# Patient Record
Sex: Female | Born: 1965 | Race: White | Hispanic: No | Marital: Married | Smoking: Never smoker
Health system: Southern US, Community
[De-identification: ages and names within clinical notes are randomized; demographics above are authoritative.]

## PROBLEM LIST (undated history)

## (undated) DIAGNOSIS — N200 Calculus of kidney: Secondary | ICD-10-CM

## (undated) DIAGNOSIS — I493 Ventricular premature depolarization: Secondary | ICD-10-CM

## (undated) DIAGNOSIS — E079 Disorder of thyroid, unspecified: Secondary | ICD-10-CM

## (undated) HISTORY — PX: CHOLECYSTECTOMY: SHX55

## (undated) HISTORY — DX: Ventricular premature depolarization: I49.3

## (undated) HISTORY — PX: OTHER SURGICAL HISTORY: SHX169

## (undated) HISTORY — PX: KNEE SURGERY: SHX244

---

## 1998-07-23 ENCOUNTER — Other Ambulatory Visit: Admission: RE | Admit: 1998-07-23 | Discharge: 1998-07-23 | Payer: Self-pay | Admitting: *Deleted

## 1999-06-11 ENCOUNTER — Other Ambulatory Visit: Admission: RE | Admit: 1999-06-11 | Discharge: 1999-06-11 | Payer: Self-pay | Admitting: *Deleted

## 2000-05-31 ENCOUNTER — Emergency Department (HOSPITAL_COMMUNITY): Admission: EM | Admit: 2000-05-31 | Discharge: 2000-05-31 | Payer: Self-pay | Admitting: Emergency Medicine

## 2000-05-31 ENCOUNTER — Encounter: Payer: Self-pay | Admitting: Emergency Medicine

## 2000-06-02 ENCOUNTER — Encounter: Payer: Self-pay | Admitting: Emergency Medicine

## 2000-06-02 ENCOUNTER — Ambulatory Visit (HOSPITAL_COMMUNITY): Admission: RE | Admit: 2000-06-02 | Discharge: 2000-06-02 | Payer: Self-pay | Admitting: Emergency Medicine

## 2002-11-01 ENCOUNTER — Inpatient Hospital Stay (HOSPITAL_COMMUNITY): Admission: EM | Admit: 2002-11-01 | Discharge: 2002-11-04 | Payer: Self-pay | Admitting: Hematology & Oncology

## 2002-11-01 ENCOUNTER — Encounter: Payer: Self-pay | Admitting: Hematology & Oncology

## 2002-11-02 ENCOUNTER — Encounter: Payer: Self-pay | Admitting: Oncology

## 2002-11-04 ENCOUNTER — Encounter: Payer: Self-pay | Admitting: Hematology & Oncology

## 2007-03-16 ENCOUNTER — Ambulatory Visit: Payer: Self-pay | Admitting: Family Medicine

## 2007-03-16 ENCOUNTER — Observation Stay (HOSPITAL_COMMUNITY): Admission: AD | Admit: 2007-03-16 | Discharge: 2007-03-18 | Payer: Self-pay | Admitting: Family Medicine

## 2007-07-04 ENCOUNTER — Encounter: Payer: Self-pay | Admitting: Internal Medicine

## 2007-07-04 ENCOUNTER — Ambulatory Visit (HOSPITAL_COMMUNITY): Admission: RE | Admit: 2007-07-04 | Discharge: 2007-07-04 | Payer: Self-pay | Admitting: Internal Medicine

## 2007-07-06 ENCOUNTER — Encounter: Payer: Self-pay | Admitting: Internal Medicine

## 2008-01-04 ENCOUNTER — Ambulatory Visit: Payer: Self-pay | Admitting: Internal Medicine

## 2008-01-04 DIAGNOSIS — R079 Chest pain, unspecified: Secondary | ICD-10-CM | POA: Insufficient documentation

## 2008-01-04 DIAGNOSIS — R059 Cough, unspecified: Secondary | ICD-10-CM | POA: Insufficient documentation

## 2008-01-04 DIAGNOSIS — R05 Cough: Secondary | ICD-10-CM

## 2008-01-04 DIAGNOSIS — J45909 Unspecified asthma, uncomplicated: Secondary | ICD-10-CM | POA: Insufficient documentation

## 2008-01-04 DIAGNOSIS — R0602 Shortness of breath: Secondary | ICD-10-CM | POA: Insufficient documentation

## 2008-01-04 DIAGNOSIS — R062 Wheezing: Secondary | ICD-10-CM | POA: Insufficient documentation

## 2008-01-04 LAB — CONVERTED CEMR LAB: IgE (Immunoglobulin E), Serum: 9.7 intl units/mL (ref 0.0–180.0)

## 2008-01-07 ENCOUNTER — Telehealth: Payer: Self-pay | Admitting: Internal Medicine
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
## 2010-10-08 NOTE — Discharge Summary (Signed)
[name], [name]                       ACCOUNT NO.:  [id number]   MEDICAL RECORD NO.:  [id number]                   PATIENT TYPE:  INP   LOCATION:  0260                                 FACILITY:  [facility]   PHYSICIAN:  Rose Phi. Myna Hidalgo, M.D.              DATE OF BIRTH:  30-May-1965   DATE OF ADMISSION:  11/01/2002  DATE OF DISCHARGE:  11/04/2002                                 DISCHARGE SUMMARY   DISCHARGE DIAGNOSES:  1. Transient pancytopenia secondary to viral versus drug effect.  2. Elevated liver function tests.  3. Bronchitis.  4. Depression.   CONDITION ON DISCHARGE:  Stable.   ACTIVITY:  As tolerated.   DIET:  No fried or greasy food for four days.   FOLLOW UP:  The patient will come back to the office in one week for blood  work.   MEDICATIONS:  1. Wellbutrin XL 150 mg p.o. daily.  2. Tequin 400 mg p.o. daily x4 days.  3. Tussionex elixir 5 mL p.o. q.12h. p.r.n.  4. Phenergan 25 mg p.o. q.6h. p.r.n.   HOSPITAL COURSE:  The patient was admitted after coming to the office on  Friday.  She presented with pancytopenia.  She had a fever.  Her blood smear  in the office was pretty much unremarkable.   We admitted her.  Blood cultures were taken.  These were all negative.  She  had an IV started.  She was on IV fluids.  She was found to have some  elevated LFTs.  She does have history of cholecystectomy eight years ago.   She did have a chest x-ray which showed some bronchitic changes.  No lung  infiltrates were noted.  No adenopathy was appreciated in the mediastinum.   She was started on IV antibiotics with IV Tequin.  She was afebrile during  her hospital course.  She had a little bit of nausea, but no vomiting.  She  was eating well.  Her cough was much improved.   On Monday the 14th I had planned to do a bone marrow test on her.  However,  I felt that her blood work started to improve.  Her platelet count had gone  to 121,000.  White cell count had gone to  2600.   All of her laboratory work was unremarkable for anemia.  Her haptoglobin was  normal.  Reticulocyte count was negative.  Direct Coombs' was negative.   We did schedule her for an ultrasound of her liver prior to discharge.  These results are pending.   She felt well enough that she wished to go home.   Upon discharge her LFTs were still elevated.  Alkaline phosphatase was 171,  SGPT 350, SGOT 267.  Albumin was 2.8, protein 5.6, sodium 144, potassium  3.7.  LDH was 350.   She had no hepatomegaly on examination.  Her liver was nontender to  palpation.  She had no  splenomegaly.                                               Rose Phi. Myna Hidalgo, M.D.    PRE/MEDQ  D:  11/04/2002  T:  11/04/2002  Job:  [id number]   cc:   Tracey Harries, M.D.  [address]  [place]  [place] [address]  Fax: [phone]
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
## 2010-10-08 NOTE — H&P (Signed)
[name], [name]                       ACCOUNT NO.:  [id number]   MEDICAL RECORD NO.:  [id number]                   PATIENT TYPE:  INP   LOCATION:  0260                                 FACILITY:  [facility]   PHYSICIAN:  Rose Phi. Myna Hidalgo, M.D.              DATE OF BIRTH:  1965-07-24   DATE OF ADMISSION:  11/01/2002  DATE OF DISCHARGE:                                HISTORY & PHYSICAL   REASON FOR ADMISSION:  1. Pancytopenia.  2. Fever.   HISTORY OF PRESENT ILLNESS:  The patient is a very nice 45 year old white  female with a past history significant for situational depression.  She has  been on Wellbutrin for quite a while.  She has been started on Depakote  about four months ago.   She had been feeling well until recently.  She then developed fevers and  weakness.  She had a dry cough.  She had a little bit of a headache.   She was seen at, I think, at Urgent Care.  She had a CBC done a the Urgent  Care.  This showed a white cell count of 1300, hemoglobin 12, hematocrit 35,  and platelet count 71,000.  Her ANC was 800.   Dr. Everlene Other called the office.  It was felt that she needed to be seen the  next day in the office.   She came into the office.  It was obvious that she was not feeling well.  She had a dry cough.  She had a temperature of 100.3.  Subsequently, she is  being admitted for evaluation of her pancytopenia and IV antibiotics.   PAST MEDICAL HISTORY:  Remarkable for the depression.   ALLERGIES:  PENICILLIN.   MEDICATIONS:  1. Wellbutrin XL 150 mg p.o. daily.  2. Depakote 250 mg p.o. q.h.s.  3. Tequin 400 mg p.o. daily.   SOCIAL HISTORY:  Negative to tobacco or alcohol use.  There is no  occupational exposures.   FAMILY HISTORY:  Noncontributory.   REVIEW OF SYSTEMS:  As in the History of Present Illness.   PHYSICAL EXAMINATION:  GENERAL:  This is a well-developed, well-nourished  white female in no obvious distress.  VITAL SIGNS:  Temperature 100.3,  pulse 103, respiratory rate 20, blood  pressure 119/81, weight 143.  HEAD AND NECK:  Shows normocephalic, atraumatic skull.  She has no  mucositis.  There is no scleral icterus.  Pupils react appropriately.  She  had no adenopathy in the neck.  Her neck is supple with good range of  motion.  Thyroid is nonpalpable.  LUNGS:  With clear breath sounds bilaterally.  No rales, wheezes, or rhonchi  are noted.  CARDIAC:  Tachycardia but regular.  No murmurs, rubs, or bruits.  ABDOMEN:  Soft with good bowel sounds.  There was no fluid wave.  There is  no palpable hepatosplenomegaly.  BACK:  No tenderness of the spine, ribs or  hips.  EXTREMITIES:  Shows no clubbing, cyanosis, or edema.  SKIN:  No rashes, ecchymoses, or petechia.  NEUROLOGICAL:  Shows no focal neurological deficits.   LABORATORY STUDIES:  White cell count 1400, ANC 500, hemoglobin 11,  hematocrit 33, platelet count 79,000.   IMPRESSION:  Ms. Broden is a 45 year old female with pancytopenia.  The  etiology is unclear.  A blood smear certainly did not show any obvious  leukemia.  She certainly could have an underlying leukemia particularly  acute promyelocytic leukemia.  However, she has not had any problems with  bleeding or bruising.   This could be a viral syndrome.  It could be possible that the Depakote is a  culprit although this would be very rare.   She needs to have a bone marrow done.  She needs IV antibiotics.  We need to  check cultures on her.  We need to do a chest x-ray.                                               Rose Phi. Myna Hidalgo, M.D.    PRE/MEDQ  D:  11/01/2002  T:  11/01/2002  Job:  [id number]

## 2011-02-06 ENCOUNTER — Encounter: Payer: Self-pay | Admitting: *Deleted

## 2011-02-06 ENCOUNTER — Emergency Department (HOSPITAL_BASED_OUTPATIENT_CLINIC_OR_DEPARTMENT_OTHER)
Admission: EM | Admit: 2011-02-06 | Discharge: 2011-02-06 | Disposition: A | Discharge status: Home / Self Care | Payer: BC Managed Care – PPO | Attending: Emergency Medicine | Admitting: Emergency Medicine

## 2011-02-06 ENCOUNTER — Emergency Department (INDEPENDENT_AMBULATORY_CARE_PROVIDER_SITE_OTHER): Payer: BC Managed Care – PPO

## 2011-02-06 DIAGNOSIS — N269 Renal sclerosis, unspecified: Secondary | ICD-10-CM

## 2011-02-06 DIAGNOSIS — R109 Unspecified abdominal pain: Secondary | ICD-10-CM | POA: Insufficient documentation

## 2011-02-06 DIAGNOSIS — D1809 Hemangioma of other sites: Secondary | ICD-10-CM

## 2011-02-06 DIAGNOSIS — N12 Tubulo-interstitial nephritis, not specified as acute or chronic: Secondary | ICD-10-CM | POA: Insufficient documentation

## 2011-02-06 DIAGNOSIS — M8569 Other cyst of bone, multiple sites: Secondary | ICD-10-CM

## 2011-02-06 HISTORY — DX: Calculus of kidney: N20.0

## 2011-02-06 HISTORY — DX: Disorder of thyroid, unspecified: E07.9

## 2011-02-06 LAB — URINALYSIS, ROUTINE W REFLEX MICROSCOPIC
Protein, ur: NEGATIVE mg/dL
Specific Gravity, Urine: 1.016 (ref 1.005–1.030)
Urobilinogen, UA: 0.2 mg/dL (ref 0.0–1.0)

## 2011-02-06 LAB — URINE MICROSCOPIC-ADD ON

## 2011-02-06 LAB — PREGNANCY, URINE: Preg Test, Ur: NEGATIVE

## 2011-02-06 MED ORDER — OXYCODONE-ACETAMINOPHEN 5-325 MG PO TABS
2.0000 | ORAL_TABLET | Freq: Once | ORAL | Status: AC
  Administered 2011-02-06: 2 via ORAL
  Filled 2011-02-06 (×2): qty 1

## 2011-02-06 MED ORDER — ONDANSETRON HCL 4 MG PO TABS: ORAL_TABLET | ORAL | Status: DC

## 2011-02-06 MED ORDER — ONDANSETRON 8 MG PO TBDP
8.0000 mg | ORAL_TABLET | Freq: Once | ORAL | Status: AC
  Administered 2011-02-06: 8 mg via ORAL
  Filled 2011-02-06: qty 1

## 2011-02-06 MED ORDER — NAPROXEN 500 MG PO TABS: 500.0000 mg | ORAL_TABLET | Freq: Two times a day (BID) | ORAL | Status: DC

## 2011-02-06 MED ORDER — OXYCODONE-ACETAMINOPHEN 5-325 MG PO TABS: 2.0000 | ORAL_TABLET | ORAL | Status: AC | PRN

## 2011-02-06 MED ORDER — PHENAZOPYRIDINE HCL 200 MG PO TABS: 200.0000 mg | ORAL_TABLET | Freq: Three times a day (TID) | ORAL | Status: AC

## 2011-02-06 MED ORDER — CIPROFLOXACIN HCL 500 MG PO TABS
500.0000 mg | ORAL_TABLET | Freq: Once | ORAL | Status: AC
  Administered 2011-02-06: 500 mg via ORAL
  Filled 2011-02-06: qty 1

## 2011-02-06 MED ORDER — CIPROFLOXACIN HCL 500 MG PO TABS: 500.0000 mg | ORAL_TABLET | Freq: Two times a day (BID) | ORAL | Status: AC

## 2011-02-06 NOTE — ED Notes (Signed)
Rx x 5 given at d/c- pt d/c home with ride

## 2011-02-06 NOTE — ED Provider Notes (Signed)
History     CSN: [id number] Arrival date & time: 02/06/2011  6:28 PM Scribed for Brandy Mackie, MD, the patient was seen in room MH05/MH05. This chart was scribed by Katha Cabal. This patient's care was started at 7:50PM.    Chief Complaint  Patient presents with  . Flank Pain     HPI  [name] is a 45 y.o. female who presents to the Emergency Department complaining of gradual worsening of sharp left flank pain with associated urinary frequency, chills, and nausea. Pt noticed initial dull pain on Friday, 3 days ago.  Pain has been persistent since last night.   Pt has had urinary frequency but no dysuria.  Pain somewhat similar to last kidney stone.  No fevers, no vomiting, but has been nauseated. Eating and drinking well.   Denies vaginal discharge, urinary and bowel incontinence.  Patient notes that she took her last Zithromax pill today that was prescribed for cold.  Patient adds that she has a history of kidney stones and was able to pass a small stone during her last episode 10-12 years ago.   PAST MEDICAL HISTORY:  Past Medical History  Diagnosis Date  . Kidney stone   . Endometriosis   . Asthma   . Thyroid disease     PAST SURGICAL HISTORY:  Past Surgical History  Procedure Date  . Cholecystectomy   . Knee surgery   . Laproscopy     FAMILY HISTORY:  History reviewed. No pertinent family history.   SOCIAL HISTORY: History   Social History  . Marital Status: Married    Spouse Name: N/A    Number of Children: N/A  . Years of Education: N/A   Social History Main Topics  . Smoking status: Current Everyday Smoker  . Smokeless tobacco: None  . Alcohol Use: No  . Drug Use: No  . Sexually Active:    Other Topics Concern  . None   Social History Narrative  . None     Review of Systems 10 Systems reviewed and are negative for acute change except as noted in the HPI.  Allergies  Penicillins  Home Medications   Current Outpatient Rx  Name  Route Sig Dispense Refill  . AZITHROMYCIN 250 MG PO TABS Oral Take 250 mg by mouth daily.     . BUDESONIDE-FORMOTEROL FUMARATE 80-4.5 MCG/ACT IN AERO Inhalation Inhale 2 puffs into the lungs 2 (two) times daily.      Marland Kitchen CAPSAICIN 0.025 % EX PADS Apply externally Apply 1 patch topically every 8 (eight) hours as needed. pain     . LORATADINE 10 MG PO TABS Oral Take 10 mg by mouth daily.      . OLOPATADINE HCL 0.6 % NA SOLN Nasal Place 1 spray into the nose daily.      . TRIAMCINOLONE ACETONIDE 55 MCG/ACT NA INHA Nasal Place 2 sprays into the nose daily.        Physical Exam    BP 156/108  Pulse 86  Temp(Src) 98.1 F (36.7 C) (Oral)  Resp 20  Ht 5\' 4"  (1.626 m)  Wt 145 lb (65.772 kg)  BMI 24.89 kg/m2  SpO2 100%  LMP 01/30/2011  Physical Exam  Nursing note and vitals reviewed. Constitutional: She is oriented to person, place, and time. She appears well-developed and well-nourished.  HENT:  Head: Normocephalic and atraumatic.  Eyes: EOM are normal. Pupils are equal, round, and reactive to light.  Neck: Normal range of motion. Neck supple.  Cardiovascular: Normal rate and regular rhythm.   Pulmonary/Chest: Effort normal and breath sounds normal. She has no wheezes. She has no rales.  Abdominal: Soft. There is CVA tenderness (left ).       LLQ tenderness  Musculoskeletal: Normal range of motion. She exhibits no edema.  Neurological: She is alert and oriented to person, place, and time.  Skin: Skin is warm and dry.  Psychiatric: She has a normal mood and affect. Her behavior is normal.    ED Course  Procedures OTHER DATA REVIEWED: Nursing notes, vital signs, and past medical records reviewed.   DIAGNOSTIC STUDIES: Oxygen Saturation is 100% on room air, normal by my interpretation.     LABS / RADIOLOGY:  Results for orders placed during the hospital encounter of 02/06/11  URINALYSIS, ROUTINE W REFLEX MICROSCOPIC      Component Value Range   Color, Urine YELLOW  YELLOW     Appearance CLOUDY (*) CLEAR    Specific Gravity, Urine 1.016  1.005 - 1.030    pH 7.0  5.0 - 8.0    Glucose, UA NEGATIVE  NEGATIVE (mg/dL)   Hgb urine dipstick MODERATE (*) NEGATIVE    Bilirubin Urine NEGATIVE  NEGATIVE    Ketones, ur NEGATIVE  NEGATIVE (mg/dL)   Protein, ur NEGATIVE  NEGATIVE (mg/dL)   Urobilinogen, UA 0.2  0.0 - 1.0 (mg/dL)   Nitrite NEGATIVE  NEGATIVE    Leukocytes, UA LARGE (*) NEGATIVE   PREGNANCY, URINE      Component Value Range   Preg Test, Ur NEGATIVE    URINE MICROSCOPIC-ADD ON      Component Value Range   Squamous Epithelial / LPF MANY (*) RARE    WBC, UA TOO NUMEROUS TO COUNT  <3 (WBC/hpf)   RBC / HPF 7-10  <3 (RBC/hpf)   Bacteria, UA MANY (*) RARE      Ct Abdomen Pelvis Wo Contrast  02/06/2011  *RADIOLOGY REPORT*  Clinical Data: Left flank pain.  CT ABDOMEN AND PELVIS WITHOUT CONTRAST  Technique:  Multidetector CT imaging of the abdomen and pelvis was performed following the standard protocol without intravenous contrast.  Comparison: Report CT abdomen 11/02/2002.  Findings: Lung bases are clear.  22 mm x 21 mm low density lesion is present in the right hepatic lobe.  This was previously described as 2 cm and consistent with hemangioma.  By report, no interval change.  Cholecystectomy.  Right upper pole renal cyst is present measuring 35 mm.  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  No ureteral calculi are present.  The left kidney appears within normal limits.  The spleen shows old granulomatous disease.  Normal adrenal glands bilaterally.  Pancreas is grossly unremarkable. Unenhanced CT was performed per clinician order.  Lack of IV contrast limits sensitivity and specificity, especially for evaluation of abdominal/pelvic solid viscera.  Large stool burden is present in the right colon.  No right lower quadrant inflammatory changes.  Normal  appendix.  Uterus and adnexa appear normal.  Distal colon appears normal.  No free fluid.  No free air. Urinary bladder normal.  1 cm peripherally sclerotic cyst is present in the left femoral head.  Benign bone island is present in the right femoral head.  No aggressive osseous lesions are identified.  Left SI joint degenerative disease and vacuum joint.  IMPRESSION: 1.  No acute abnormality. 2.  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 3.  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 4.  Cholecystectomy. 5.  Old granulomatous disease of the spleen.  Original Report Authenticated By: Andreas Newport, M.D.     ED COURSE / COORDINATION OF CARE:  Orders Placed This Encounter  Procedures  . Urine culture  . CT Abdomen Pelvis Wo Contrast  . Urinalysis with microscopic  . Pregnancy, urine  . Urine microscopic-add on    MDM: Pt with worsening left flank pain x 3 days, urine with many epis but also with tntc wbc.  No stone seen on ct scan.  Will treat for pyelonephritis.  No fevers, no vomiting, feel pt is good candidate for outpatient management.  Discussed with pt and husband reasons for return and possible admission if she is failing outpatient management.   IMPRESSION: Pyelonephritis   MEDICATIONS GIVEN IN THE E.D. Scheduled Meds:    . ciprofloxacin  500 mg Oral Once  . ondansetron  8 mg Oral Once  . oxyCODONE-acetaminophen  2 tablet Oral Once        I personally performed the services described in this documentation, which was scribed in my presence. The recorded information has been reviewed and considered. Brandy Mackie, MD       Brandy Mackie, MD 02/06/11 [phone]

## 2011-02-06 NOTE — ED Notes (Signed)
Pt has hx of kidney stones. Left flank pain and nausea since last p.m.

## 2011-02-07 LAB — URINE CULTURE

## 2011-03-02 LAB — BASIC METABOLIC PANEL
Calcium: 9.4
GFR calc Af Amer: >60
GFR calc non Af Amer: >60
Potassium: 4.3
Sodium: 135

## 2011-03-02 LAB — CBC
HCT: 37.1
Hemoglobin: 12.5
WBC: 9.4

## 2011-12-05 ENCOUNTER — Encounter: Payer: Self-pay | Admitting: Cardiology

## 2011-12-06 ENCOUNTER — Ambulatory Visit (INDEPENDENT_AMBULATORY_CARE_PROVIDER_SITE_OTHER): Payer: BC Managed Care – PPO | Admitting: Cardiology

## 2011-12-06 ENCOUNTER — Encounter: Payer: Self-pay | Admitting: Cardiology

## 2011-12-06 VITALS — BP 134/92 | HR 86 | Ht 64.0 in | Wt 146.1 lb

## 2011-12-06 DIAGNOSIS — I10 Essential (primary) hypertension: Secondary | ICD-10-CM

## 2011-12-06 DIAGNOSIS — I493 Ventricular premature depolarization: Secondary | ICD-10-CM

## 2011-12-06 DIAGNOSIS — R0602 Shortness of breath: Secondary | ICD-10-CM

## 2011-12-06 DIAGNOSIS — R9431 Abnormal electrocardiogram [ECG] [EKG]: Secondary | ICD-10-CM

## 2011-12-06 DIAGNOSIS — R079 Chest pain, unspecified: Secondary | ICD-10-CM

## 2011-12-06 DIAGNOSIS — R002 Palpitations: Secondary | ICD-10-CM

## 2011-12-06 DIAGNOSIS — I4949 Other premature depolarization: Secondary | ICD-10-CM

## 2011-12-06 MED ORDER — BISOPROLOL FUMARATE 5 MG PO TABS: 2.5000 mg | ORAL_TABLET | Freq: Every day | ORAL | Status: DC

## 2011-12-06 NOTE — Patient Instructions (Addendum)
Continue current medications as listed.  Your physician has requested that you have an exercise tolerance test. For further information please visit [email or web address] Please also follow instruction sheet, as given.  Your physician has recommended that you wear a holter monitor. Holter monitors are medical devices that record the heart's electrical activity. Doctors most often use these monitors to diagnose arrhythmias. Arrhythmias are problems with the speed or rhythm of the heartbeat. The monitor is a small, portable device. You can wear one while you do your normal daily activities. This is usually used to diagnose what is causing palpitations/syncope (passing out).  Your physician has requested that you have an echocardiogram. Echocardiography is a painless test that uses sound waves to create images of your heart. It provides your doctor with information about the size and shape of your heart and how well your heart's chambers and valves are working. This procedure takes approximately one hour. There are no restrictions for this procedure.  Follow up will be placed on the results of testing.

## 2011-12-06 NOTE — Progress Notes (Signed)
HPI The patient presents for evaluation of the jaw arm discomfort. This has been going on for a couple of months. She describes discomfort that starts in her left jaw and down into her shoulder. It goes into her left arm tingles in her hand. She thinks it's increasing in frequency with time. She does not describe palpitations associated with this. She might get slightly nauseated or diaphoretic. She might have some discomfort in her chest it goes through to her back associated with this. This is more of an aching discomfort. She does think she might be a bring the symptoms on the such activities as climbing stairs. She has not been exercising. She has been under stress with 2 teenage sons. She's not describing PND or orthopnea. She's had no presyncope or syncope. She's had no prior cardiac workup. Of note on an EKG recently she was noted to have PVCs  Allergies  Allergen Reactions  . Penicillins Hives    Current Outpatient Prescriptions  Medication Sig Dispense Refill  . budesonide-formoterol (SYMBICORT) 80-4.5 MCG/ACT inhaler Inhale 2 puffs into the lungs 2 (two) times daily.        Marland Kitchen levothyroxine (SYNTHROID, LEVOTHROID) 50 MCG tablet Take 50 mcg by mouth daily.      Marland Kitchen loratadine (CLARITIN) 10 MG tablet Take 10 mg by mouth daily.        . nebivolol (BYSTOLIC) 5 MG tablet Take 5 mg by mouth daily.      . Olopatadine HCl (PATANASE) 0.6 % SOLN Place 1 spray into the nose daily.        Marland Kitchen triamcinolone (NASACORT AQ) 55 MCG/ACT nasal inhaler Place 2 sprays into the nose daily.          Past Medical History  Diagnosis Date  . Kidney stone   . Endometriosis   . Asthma   . Thyroid disease     Past Surgical History  Procedure Date  . Cholecystectomy   . Knee surgery   . Laproscopy     No family history on file.  History   Social History  . Marital Status: Married    Spouse Name: N/A    Number of Children: N/A  . Years of Education: N/A   Occupational History  . Not on file.    Social History Main Topics  . Smoking status: Never Smoker   . Smokeless tobacco: Not on file  . Alcohol Use: No  . Drug Use: No  . Sexually Active: Not on file   Other Topics Concern  . Not on file   Social History Narrative  . No narrative on file    ROS:  Positive for headaches, dyspnea, nausea, diarrhea, seasonal allergies. Marland Kitchenother  PHYSICAL EXAM BP 134/92  Pulse 86  Ht 5\' 4"  (1.626 m)  Wt 146 lb 1.9 oz (66.28 kg)  BMI 25.08 kg/m2 GENERAL:  Well appearing HEENT:  Pupils equal round and reactive, fundi not visualized, oral mucosa unremarkable NECK:  No jugular venous distention, waveform within normal limits, carotid upstroke brisk and symmetric, no bruits, no thyromegaly LYMPHATICS:  No cervical, inguinal adenopathy LUNGS:  Clear to auscultation bilaterally BACK:  No CVA tenderness CHEST:  Unremarkable HEART:  PMI not displaced or sustained,S1 and S2 within normal limits, no S3, no S4, no clicks, no rubs, no murmurs ABD:  Flat, positive bowel sounds normal in frequency in pitch, no bruits, no rebound, no guarding, no midline pulsatile mass, no hepatomegaly, no splenomegaly EXT:  2 plus pulses throughout, no edema,  no cyanosis no clubbing SKIN:  No rashes no nodules NEURO:  Cranial nerves II through XII grossly intact, motor grossly intact throughout PSYCH:  Cognitively intact, oriented to person place and time   EKG:  Normal sinus rhythm, leftward axis, left ventricular hypertrophy by voltage criteria, RSR prime V1 and V2, borderline QTC prolongation, no acute ST-T wave changes. 12/06/2011  ASSESSMENT AND PLAN   Chest pain - Her chest pain is atypical but she does have a significant family history.  I will bring the patient back for a POET (Plain Old Exercise Test). This will allow me to screen for obstructive coronary disease, risk stratify and very importantly provide a prescription for exercise.  HTN -  She was given a prescription for Bystolic.  This is a  reasonable choice but expensive. I think she could try bisoprolol which is beta selective. We discussed if she has any wheezing or might suggest a calcium channel blocker. We discussed keeping a blood pressure diary.  Abnormal EKG -  I will order an echocardiogram given this and the PVCs. She will also have a Holter monitor. She reports recent normal blood work which she thinks includes a TSH and basic metabolic profile.

## 2011-12-20 ENCOUNTER — Ambulatory Visit (HOSPITAL_COMMUNITY): Payer: BC Managed Care – PPO | Attending: Cardiology | Admitting: Radiology

## 2011-12-20 DIAGNOSIS — R0989 Other specified symptoms and signs involving the circulatory and respiratory systems: Secondary | ICD-10-CM

## 2011-12-20 DIAGNOSIS — R072 Precordial pain: Secondary | ICD-10-CM | POA: Insufficient documentation

## 2011-12-20 DIAGNOSIS — R0609 Other forms of dyspnea: Secondary | ICD-10-CM

## 2011-12-20 DIAGNOSIS — R0602 Shortness of breath: Secondary | ICD-10-CM | POA: Insufficient documentation

## 2011-12-20 DIAGNOSIS — I10 Essential (primary) hypertension: Secondary | ICD-10-CM | POA: Insufficient documentation

## 2011-12-20 DIAGNOSIS — I079 Rheumatic tricuspid valve disease, unspecified: Secondary | ICD-10-CM | POA: Insufficient documentation

## 2011-12-20 NOTE — Progress Notes (Signed)
Echocardiogram performed.  

## 2011-12-27 ENCOUNTER — Encounter (INDEPENDENT_AMBULATORY_CARE_PROVIDER_SITE_OTHER): Payer: BC Managed Care – PPO

## 2011-12-27 DIAGNOSIS — I4949 Other premature depolarization: Secondary | ICD-10-CM

## 2012-01-02 ENCOUNTER — Encounter: Payer: Self-pay | Admitting: Nurse Practitioner

## 2012-01-02 ENCOUNTER — Ambulatory Visit (INDEPENDENT_AMBULATORY_CARE_PROVIDER_SITE_OTHER): Payer: BC Managed Care – PPO | Admitting: Nurse Practitioner

## 2012-01-02 DIAGNOSIS — R079 Chest pain, unspecified: Secondary | ICD-10-CM

## 2012-01-02 DIAGNOSIS — R002 Palpitations: Secondary | ICD-10-CM

## 2012-01-02 DIAGNOSIS — R0602 Shortness of breath: Secondary | ICD-10-CM

## 2012-01-02 NOTE — Procedures (Signed)
Exercise Treadmill Test  Pre-Exercise Testing Evaluation Rhythm: normal sinus  Rate: 82   PR:  .14 QRS:  .10  QT:  .38 QTc: .44     Test  Exercise Tolerance Test Ordering MD: Angelina Sheriff, MD  Interpreting MD: Norma Fredrickson , NP  Unique Test No: 1  Treadmill:  1  Indication for ETT: chest pain - rule out ischemia  Contraindication to ETT: No   Stress Modality: exercise - treadmill  Cardiac Imaging Performed: non   Protocol: standard Bruce - maximal  Max BP: 167/97  Max MPHR (bpm):  176 85% MPR (bpm):  149  MPHR obtained (bpm):  157 % MPHR obtained:  89%  Reached 85% MPHR (min:sec):  7:50 Total Exercise Time (min-sec):  9:00  Workload in METS:  10.1 Borg Scale: 17  Reason ETT Terminated:  desired heart rate attained    ST Segment Analysis At Rest: normal ST segments - no evidence of significant ST depression With Exercise: no evidence of significant ST depression  Other Information Arrhythmia:  No Angina during ETT:  absent (0) Quality of ETT:  diagnostic  ETT Interpretation:  normal - no evidence of ischemia by ST analysis  Comments: Patient presents today for routine GXT due to atypical chest pain in the setting of elevated BP and PVCs. Has had a normal echo. Holter monitor still in process. She is doing better with the addition of Bystolic. Reports BP has come down nicely at home. No further chest pain.   She exercised today on the standard Bruce/Hochrein protocol. She exercised for a total of 9 minutes. Good exercise tolerance. Adequate blood pressure response. Clinically negative. EKG negative for ischemia and arrhythmia.   Recommendations: Reassurance.  Risk factor modification with exercise and weight loss. Continue current medicines. She is to continue to monitor her blood pressure at home.  Will call with Holter results once available.  Will see her back prn.  atient is agreeable to this plan and will call if any problems develop in the interim.

## 2012-01-05 ENCOUNTER — Telehealth: Payer: Self-pay

## 2012-01-05 NOTE — Telephone Encounter (Signed)
Fu call °Patient returning your call °

## 2012-01-05 NOTE — Telephone Encounter (Signed)
Left message for patient to call office for holter monitor results

## 2012-01-06 ENCOUNTER — Telehealth: Payer: Self-pay

## 2012-01-06 NOTE — Telephone Encounter (Signed)
Called patient to see if anyone called her about her monitor results, and Hubert Azure called her on 01/05/2012

## 2014-06-19 ENCOUNTER — Telehealth: Payer: Self-pay | Admitting: Cardiology

## 2014-06-19 NOTE — Telephone Encounter (Signed)
Received records from [facility] (Dr Virgina Jock) for appointment on 07/10/14 with Dr Percival Spanish.  Records given to [facility] (medical records) for Dr Hochrein's schedule on 07/10/14.  lp

## 2014-07-10 ENCOUNTER — Encounter: Payer: Self-pay | Admitting: Cardiology

## 2014-07-10 ENCOUNTER — Ambulatory Visit (INDEPENDENT_AMBULATORY_CARE_PROVIDER_SITE_OTHER): Payer: BLUE CROSS/BLUE SHIELD | Admitting: Cardiology

## 2014-07-10 VITALS — Ht 64.0 in | Wt 142.0 lb

## 2014-07-10 DIAGNOSIS — R0789 Other chest pain: Secondary | ICD-10-CM

## 2014-07-10 DIAGNOSIS — R072 Precordial pain: Secondary | ICD-10-CM

## 2014-07-10 NOTE — Progress Notes (Signed)
HPI  The patient presents for evaluation of neck and shoulder discomfort.   I actually saw her in the past for evaluation of this. She had a treadmill test in 2013 which was unremarkable a normal echocardiogram. She  Presents for evaluation of discomfort that sounds somewhat similar to that presentation. She had a severe episode about a month ago of 8 out of 10 chest discomfort, left shoulder discomfort and jaw discomfort. She's been continuing to get this sporadically. Seems to be worse lying flat. It is not necessarily reproducible with activity. It's not as severe as it was initially. It might be 4 out of 10 in intensity. There is some shortness of breath. She doesn't think she can bring it on. She doesn't describe associated diaphoresis, nausea or vomiting. She does have some episodes of her fingers or toes getting blue. This is isolated fingers. She has cold feet.  Allergies  Allergen Reactions  . Penicillins Hives    Current Outpatient Prescriptions  Medication Sig Dispense Refill  . levothyroxine (SYNTHROID, LEVOTHROID) 50 MCG tablet Take 50 mcg by mouth daily.    Marland Kitchen loratadine (CLARITIN) 10 MG tablet Take 10 mg by mouth daily.      . nebivolol (BYSTOLIC) 5 MG tablet Take 5 mg by mouth daily.    . pregabalin (LYRICA) 75 MG capsule Take 75 mg by mouth daily.    Marland Kitchen triamcinolone (NASACORT AQ) 55 MCG/ACT nasal inhaler Place 2 sprays into the nose daily.      . bisoprolol (ZEBETA) 5 MG tablet Take 0.5 tablets (2.5 mg total) by mouth daily. 30 tablet 6   No current facility-administered medications for this visit.    Past Medical History  Diagnosis Date  . Kidney stone   . Endometriosis   . Asthma   . Thyroid disease   . PVC (premature ventricular contraction)     Past Surgical History  Procedure Laterality Date  . Cholecystectomy    . Knee surgery      Right arthroscope  . Laproscopy      ROS:  As stated in the HPI and negative for all other systems.  PHYSICAL EXAM Ht  5\' 4"  (1.626 m)  Wt 142 lb (64.411 kg)  BMI 24.36 kg/m2 GENERAL:  Well appearing HEENT:  Pupils equal round and reactive, fundi not visualized, oral mucosa unremarkable NECK:  No jugular venous distention, waveform within normal limits, carotid upstroke brisk and symmetric, no bruits, no thyromegaly LYMPHATICS:  No cervical, inguinal adenopathy LUNGS:  Clear to auscultation bilaterally BACK:  No CVA tenderness CHEST:  Unremarkable HEART:  PMI not displaced or sustained,S1 and S2 within normal limits, no S3, no S4, no clicks, no rubs, no murmurs ABD:  Flat, positive bowel sounds normal in frequency in pitch, no bruits, no rebound, no guarding, no midline pulsatile mass, no hepatomegaly, no splenomegaly EXT:  2 plus pulses throughout, no edema, no cyanosis no clubbing SKIN:  No rashes no nodules NEURO:  Cranial nerves II through XII grossly intact, motor grossly intact throughout PSYCH:  Cognitively intact, oriented to person place and time  EKG:   Sinus rhythm, rate 82 , axis within normal limits, interval within normal limits, no acute ST-T wave changes.  07/10/2014   ASSESSMENT AND PLAN  CHEST PAIN:   Her pain is somewhat atypical. However, she does have significant risk factors as her mother had early onset heart disease. Given this stress testing is indicated. I will bring the patient back for a POET (  Plain Old Exercise Test). This will allow me to screen for obstructive coronary disease, risk stratify and very importantly provide a prescription for exercise.  She will also have a coronary calcium score which will help US guide her risk factor modification.  PVCs:  She has some rare palpitations but otherwise these are treated with her beta blocker. No change in therapy is indicated.

## 2014-07-10 NOTE — Patient Instructions (Signed)
Your physician recommends that you schedule a follow-up appointment in: as needed with Dr. Percival Spanish  We have ordered a stress test for you to get done  We have ordered a CT calcium test to be done at our [address] office

## 2014-07-21 ENCOUNTER — Ambulatory Visit (INDEPENDENT_AMBULATORY_CARE_PROVIDER_SITE_OTHER)
Admission: RE | Admit: 2014-07-21 | Discharge: 2014-07-21 | Disposition: A | Discharge status: Home / Self Care | Payer: BLUE CROSS/BLUE SHIELD | Source: Ambulatory Visit | Attending: Cardiology | Admitting: Cardiology

## 2014-07-21 DIAGNOSIS — R0789 Other chest pain: Secondary | ICD-10-CM

## 2014-08-06 ENCOUNTER — Telehealth (HOSPITAL_COMMUNITY): Payer: Self-pay

## 2014-08-06 NOTE — Telephone Encounter (Signed)
Encounter complete. 

## 2014-08-07 ENCOUNTER — Ambulatory Visit (HOSPITAL_COMMUNITY)
Admission: RE | Admit: 2014-08-07 | Discharge: 2014-08-07 | Disposition: A | Discharge status: Home / Self Care | Payer: BLUE CROSS/BLUE SHIELD | Source: Ambulatory Visit | Attending: Cardiology | Admitting: Cardiology

## 2014-08-07 DIAGNOSIS — R0789 Other chest pain: Secondary | ICD-10-CM | POA: Diagnosis not present

## 2014-08-07 NOTE — Procedures (Signed)
Exercise Treadmill Test  Test  Exercise Tolerance Test Ordering MD: Marijo File, MD  Interpreting MD:   Unique Test No: 1 Treadmill:  1  Indication for ETT: chest pain - rule out ischemia  Contraindication to ETT: No   Stress Modality: exercise - treadmill  Cardiac Imaging Performed: non   Protocol: standard Bruce - maximal  Max BP:  168/89  Max MPHR (bpm):  174 85% MPR (bpm):  148  MPHR obtained (bpm):  166 % MPHR obtained:  95  Reached 85% MPHR (min:sec):  6:55 Total Exercise Time (min-sec):  9:00  Workload in METS:  10.10 Borg Scale: Not recorded  Reason ETT Terminated:  fatigue    ST Segment Analysis At Rest: normal ST segments - no evidence of significant ST depression With Exercise: no evidence of significant ST depression  Other Information Arrhythmia:  No Angina during ETT:  absent (0) Quality of ETT:  diagnostic  ETT Interpretation:  normal - no evidence of ischemia by ST analysis  Comments: The patient had an excellent good tolerance.  There was no chest pain.  There was an appropriate level of dyspnea.  There were no arrhythmias, a normal heart rate response and normal BP response.  There were no ischemic ST T wave changes and a normal heart rate recovery.   Recommendations: Negative adequate ETT.  No further testing is indicated.  Marland Kitchen

## 2015-11-16 DIAGNOSIS — E038 Other specified hypothyroidism: Secondary | ICD-10-CM | POA: Diagnosis not present

## 2015-11-16 DIAGNOSIS — Z0000 Encounter for general adult medical examination without abnormal findings: Secondary | ICD-10-CM | POA: Diagnosis not present

## 2015-11-23 DIAGNOSIS — R1310 Dysphagia, unspecified: Secondary | ICD-10-CM | POA: Diagnosis not present

## 2015-11-23 DIAGNOSIS — Z6825 Body mass index (BMI) 25.0-25.9, adult: Secondary | ICD-10-CM | POA: Diagnosis not present

## 2015-11-23 DIAGNOSIS — E038 Other specified hypothyroidism: Secondary | ICD-10-CM | POA: Diagnosis not present

## 2015-11-23 DIAGNOSIS — Z1389 Encounter for screening for other disorder: Secondary | ICD-10-CM | POA: Diagnosis not present

## 2015-11-23 DIAGNOSIS — J309 Allergic rhinitis, unspecified: Secondary | ICD-10-CM | POA: Diagnosis not present

## 2015-11-23 DIAGNOSIS — R0789 Other chest pain: Secondary | ICD-10-CM | POA: Diagnosis not present

## 2015-11-23 DIAGNOSIS — Z0000 Encounter for general adult medical examination without abnormal findings: Secondary | ICD-10-CM | POA: Diagnosis not present

## 2015-12-07 DIAGNOSIS — Z1231 Encounter for screening mammogram for malignant neoplasm of breast: Secondary | ICD-10-CM | POA: Diagnosis not present

## 2015-12-24 DIAGNOSIS — H524 Presbyopia: Secondary | ICD-10-CM | POA: Diagnosis not present

## 2015-12-24 DIAGNOSIS — H5203 Hypermetropia, bilateral: Secondary | ICD-10-CM | POA: Diagnosis not present

## 2015-12-24 DIAGNOSIS — H52223 Regular astigmatism, bilateral: Secondary | ICD-10-CM | POA: Diagnosis not present

## 2016-04-16 DIAGNOSIS — B370 Candidal stomatitis: Secondary | ICD-10-CM | POA: Diagnosis not present

## 2016-04-16 DIAGNOSIS — J45901 Unspecified asthma with (acute) exacerbation: Secondary | ICD-10-CM | POA: Diagnosis not present

## 2016-04-20 DIAGNOSIS — J209 Acute bronchitis, unspecified: Secondary | ICD-10-CM | POA: Diagnosis not present

## 2016-04-20 DIAGNOSIS — Z6825 Body mass index (BMI) 25.0-25.9, adult: Secondary | ICD-10-CM | POA: Diagnosis not present

## 2016-04-20 DIAGNOSIS — J45909 Unspecified asthma, uncomplicated: Secondary | ICD-10-CM | POA: Diagnosis not present

## 2016-11-22 DIAGNOSIS — Z0000 Encounter for general adult medical examination without abnormal findings: Secondary | ICD-10-CM | POA: Diagnosis not present

## 2016-11-22 DIAGNOSIS — E038 Other specified hypothyroidism: Secondary | ICD-10-CM | POA: Diagnosis not present

## 2016-11-29 DIAGNOSIS — J3089 Other allergic rhinitis: Secondary | ICD-10-CM | POA: Diagnosis not present

## 2016-11-29 DIAGNOSIS — E038 Other specified hypothyroidism: Secondary | ICD-10-CM | POA: Diagnosis not present

## 2016-11-29 DIAGNOSIS — Z0000 Encounter for general adult medical examination without abnormal findings: Secondary | ICD-10-CM | POA: Diagnosis not present

## 2016-11-29 DIAGNOSIS — F418 Other specified anxiety disorders: Secondary | ICD-10-CM | POA: Diagnosis not present

## 2016-11-29 DIAGNOSIS — Z1389 Encounter for screening for other disorder: Secondary | ICD-10-CM | POA: Diagnosis not present

## 2016-11-29 DIAGNOSIS — G43909 Migraine, unspecified, not intractable, without status migrainosus: Secondary | ICD-10-CM | POA: Diagnosis not present

## 2016-12-01 DIAGNOSIS — G43909 Migraine, unspecified, not intractable, without status migrainosus: Secondary | ICD-10-CM | POA: Diagnosis not present

## 2016-12-01 DIAGNOSIS — Z0000 Encounter for general adult medical examination without abnormal findings: Secondary | ICD-10-CM | POA: Diagnosis not present

## 2016-12-08 DIAGNOSIS — Z1231 Encounter for screening mammogram for malignant neoplasm of breast: Secondary | ICD-10-CM | POA: Diagnosis not present

## 2017-03-12 DIAGNOSIS — R110 Nausea: Secondary | ICD-10-CM | POA: Diagnosis not present

## 2017-03-12 DIAGNOSIS — J019 Acute sinusitis, unspecified: Secondary | ICD-10-CM | POA: Diagnosis not present

## 2017-03-12 DIAGNOSIS — H8110 Benign paroxysmal vertigo, unspecified ear: Secondary | ICD-10-CM | POA: Diagnosis not present

## 2017-10-11 DIAGNOSIS — H66009 Acute suppurative otitis media without spontaneous rupture of ear drum, unspecified ear: Secondary | ICD-10-CM | POA: Diagnosis not present

## 2018-01-02 DIAGNOSIS — Z1231 Encounter for screening mammogram for malignant neoplasm of breast: Secondary | ICD-10-CM | POA: Diagnosis not present

## 2018-03-09 DIAGNOSIS — Z0000 Encounter for general adult medical examination without abnormal findings: Secondary | ICD-10-CM | POA: Diagnosis not present

## 2018-03-09 DIAGNOSIS — E038 Other specified hypothyroidism: Secondary | ICD-10-CM | POA: Diagnosis not present

## 2018-03-15 DIAGNOSIS — Z1389 Encounter for screening for other disorder: Secondary | ICD-10-CM | POA: Diagnosis not present

## 2018-03-15 DIAGNOSIS — J45998 Other asthma: Secondary | ICD-10-CM | POA: Diagnosis not present

## 2018-03-15 DIAGNOSIS — R1310 Dysphagia, unspecified: Secondary | ICD-10-CM | POA: Diagnosis not present

## 2018-03-15 DIAGNOSIS — R030 Elevated blood-pressure reading, without diagnosis of hypertension: Secondary | ICD-10-CM | POA: Diagnosis not present

## 2018-03-15 DIAGNOSIS — E038 Other specified hypothyroidism: Secondary | ICD-10-CM | POA: Diagnosis not present

## 2018-03-15 DIAGNOSIS — Z0000 Encounter for general adult medical examination without abnormal findings: Secondary | ICD-10-CM | POA: Diagnosis not present

## 2018-05-19 DIAGNOSIS — R112 Nausea with vomiting, unspecified: Secondary | ICD-10-CM | POA: Diagnosis not present

## 2018-05-19 DIAGNOSIS — R079 Chest pain, unspecified: Secondary | ICD-10-CM | POA: Diagnosis not present

## 2018-05-19 DIAGNOSIS — R1013 Epigastric pain: Secondary | ICD-10-CM | POA: Diagnosis not present

## 2018-05-19 DIAGNOSIS — R197 Diarrhea, unspecified: Secondary | ICD-10-CM | POA: Diagnosis not present

## 2018-05-19 DIAGNOSIS — R0600 Dyspnea, unspecified: Secondary | ICD-10-CM | POA: Diagnosis not present

## 2018-05-19 DIAGNOSIS — R0902 Hypoxemia: Secondary | ICD-10-CM | POA: Diagnosis not present

## 2018-05-19 DIAGNOSIS — R0789 Other chest pain: Secondary | ICD-10-CM | POA: Diagnosis not present

## 2018-05-19 DIAGNOSIS — R0602 Shortness of breath: Secondary | ICD-10-CM | POA: Diagnosis not present

## 2018-05-20 DIAGNOSIS — R9431 Abnormal electrocardiogram [ECG] [EKG]: Secondary | ICD-10-CM | POA: Diagnosis not present

## 2018-06-10 DIAGNOSIS — Z1212 Encounter for screening for malignant neoplasm of rectum: Secondary | ICD-10-CM | POA: Diagnosis not present

## 2018-06-10 DIAGNOSIS — Z1211 Encounter for screening for malignant neoplasm of colon: Secondary | ICD-10-CM | POA: Diagnosis not present

## 2018-06-19 DIAGNOSIS — J019 Acute sinusitis, unspecified: Secondary | ICD-10-CM | POA: Diagnosis not present

## 2018-07-31 DIAGNOSIS — K219 Gastro-esophageal reflux disease without esophagitis: Secondary | ICD-10-CM | POA: Diagnosis not present

## 2018-07-31 DIAGNOSIS — R1310 Dysphagia, unspecified: Secondary | ICD-10-CM | POA: Diagnosis not present

## 2018-09-19 DIAGNOSIS — M5126 Other intervertebral disc displacement, lumbar region: Secondary | ICD-10-CM | POA: Diagnosis not present

## 2018-09-19 DIAGNOSIS — M461 Sacroiliitis, not elsewhere classified: Secondary | ICD-10-CM | POA: Diagnosis not present

## 2018-09-19 DIAGNOSIS — M47816 Spondylosis without myelopathy or radiculopathy, lumbar region: Secondary | ICD-10-CM | POA: Diagnosis not present

## 2018-09-19 DIAGNOSIS — I10 Essential (primary) hypertension: Secondary | ICD-10-CM | POA: Diagnosis not present

## 2018-11-19 DIAGNOSIS — M461 Sacroiliitis, not elsewhere classified: Secondary | ICD-10-CM | POA: Diagnosis not present

## 2018-11-19 DIAGNOSIS — M519 Unspecified thoracic, thoracolumbar and lumbosacral intervertebral disc disorder: Secondary | ICD-10-CM | POA: Diagnosis not present

## 2018-11-19 DIAGNOSIS — M5416 Radiculopathy, lumbar region: Secondary | ICD-10-CM | POA: Diagnosis not present

## 2018-11-19 DIAGNOSIS — M545 Low back pain: Secondary | ICD-10-CM | POA: Diagnosis not present

## 2019-01-25 DIAGNOSIS — K317 Polyp of stomach and duodenum: Secondary | ICD-10-CM | POA: Diagnosis not present

## 2019-01-25 DIAGNOSIS — K219 Gastro-esophageal reflux disease without esophagitis: Secondary | ICD-10-CM | POA: Diagnosis not present

## 2019-01-25 DIAGNOSIS — K2970 Gastritis, unspecified, without bleeding: Secondary | ICD-10-CM | POA: Diagnosis not present

## 2019-01-25 DIAGNOSIS — R1310 Dysphagia, unspecified: Secondary | ICD-10-CM | POA: Diagnosis not present

## 2019-01-25 DIAGNOSIS — K319 Disease of stomach and duodenum, unspecified: Secondary | ICD-10-CM | POA: Diagnosis not present

## 2019-02-07 DIAGNOSIS — K219 Gastro-esophageal reflux disease without esophagitis: Secondary | ICD-10-CM | POA: Diagnosis not present

## 2019-08-23 DIAGNOSIS — D225 Melanocytic nevi of trunk: Secondary | ICD-10-CM | POA: Diagnosis not present

## 2019-08-23 DIAGNOSIS — R208 Other disturbances of skin sensation: Secondary | ICD-10-CM | POA: Diagnosis not present

## 2019-08-23 DIAGNOSIS — L820 Inflamed seborrheic keratosis: Secondary | ICD-10-CM | POA: Diagnosis not present

## 2020-03-10 DIAGNOSIS — M545 Low back pain, unspecified: Secondary | ICD-10-CM | POA: Diagnosis not present

## 2020-03-10 DIAGNOSIS — G8929 Other chronic pain: Secondary | ICD-10-CM | POA: Diagnosis not present

## 2020-03-10 DIAGNOSIS — M533 Sacrococcygeal disorders, not elsewhere classified: Secondary | ICD-10-CM | POA: Diagnosis not present

## 2020-03-10 DIAGNOSIS — M519 Unspecified thoracic, thoracolumbar and lumbosacral intervertebral disc disorder: Secondary | ICD-10-CM | POA: Diagnosis not present

## 2020-03-10 DIAGNOSIS — R102 Pelvic and perineal pain: Secondary | ICD-10-CM | POA: Diagnosis not present

## 2020-05-05 DIAGNOSIS — E039 Hypothyroidism, unspecified: Secondary | ICD-10-CM | POA: Diagnosis not present

## 2020-05-12 DIAGNOSIS — Z0000 Encounter for general adult medical examination without abnormal findings: Secondary | ICD-10-CM | POA: Diagnosis not present

## 2020-05-12 DIAGNOSIS — E039 Hypothyroidism, unspecified: Secondary | ICD-10-CM | POA: Diagnosis not present

## 2020-05-18 DIAGNOSIS — M25511 Pain in right shoulder: Secondary | ICD-10-CM | POA: Diagnosis not present

## 2020-05-21 DIAGNOSIS — Z1231 Encounter for screening mammogram for malignant neoplasm of breast: Secondary | ICD-10-CM | POA: Diagnosis not present

## 2020-05-26 ENCOUNTER — Other Ambulatory Visit: Payer: Self-pay | Admitting: Internal Medicine

## 2020-05-26 DIAGNOSIS — Z8249 Family history of ischemic heart disease and other diseases of the circulatory system: Secondary | ICD-10-CM

## 2020-06-09 ENCOUNTER — Inpatient Hospital Stay: Admission: RE | Admit: 2020-06-09 | Payer: Self-pay | Source: Ambulatory Visit

## 2020-08-03 ENCOUNTER — Ambulatory Visit
Admission: RE | Admit: 2020-08-03 | Discharge: 2020-08-03 | Disposition: A | Discharge status: Home / Self Care | Payer: No Typology Code available for payment source | Source: Ambulatory Visit | Attending: Internal Medicine | Admitting: Internal Medicine

## 2020-08-03 DIAGNOSIS — Z8249 Family history of ischemic heart disease and other diseases of the circulatory system: Secondary | ICD-10-CM

## 2021-05-25 ENCOUNTER — Other Ambulatory Visit: Payer: Self-pay | Admitting: Internal Medicine

## 2021-05-25 DIAGNOSIS — E785 Hyperlipidemia, unspecified: Secondary | ICD-10-CM

## 2021-06-18 ENCOUNTER — Inpatient Hospital Stay: Admission: RE | Admit: 2021-06-18 | Payer: Self-pay | Source: Ambulatory Visit

## 2021-10-30 IMAGING — CT CT CARDIAC CORONARY ARTERY CALCIUM SCORE
3 series · 12 of 20 positions shown, 14 images · non-contrast
Comparison: CT abdomen 02/06/2011 and CT calcium score 07/21/2014

CLINICAL DATA: 54-year-old white female with family history of
heart disease. Screening examination.

EXAM:
CT CARDIAC CORONARY ARTERY CALCIUM SCORE
TECHNIQUE: Non-contrast imaging through the heart was performed using
prospective ECG gating. Image post processing was performed on an
independent workstation, allowing for quantitative analysis of the
heart and coronary arteries. Note that this exam targets the heart
and the chest was not imaged in its entirety.

[Series 2: calcium scoring 2.00 qr36 bestdiast 69% hrt calciu · axial · 0.33mm/px · z∈[+1753,+1781]mm · 2 of 70 slices shown]
[im 14/70  vessel]
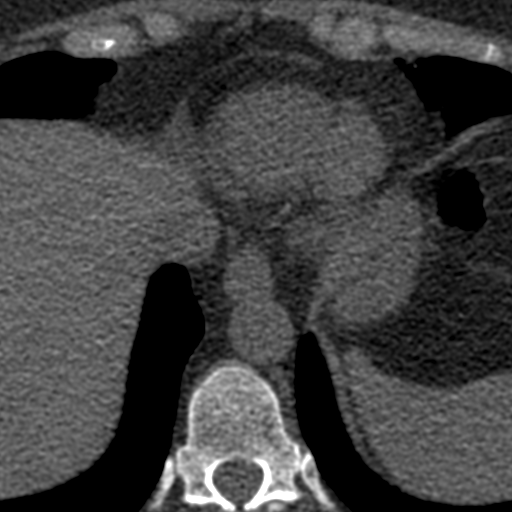
[im 28/70  vessel]
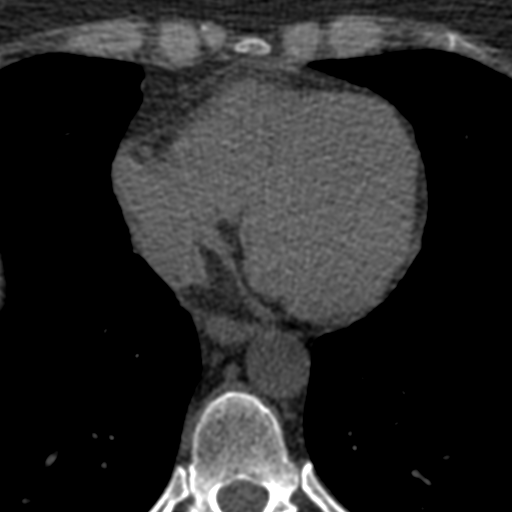

[Series 3: calcium scoring 2.00 br40 bestdiast 69% axial · axial · 0.54mm/px · z∈[+1749,+1841]mm · 5 of 70 slices shown, 7 images]
[im 12/70  vessel]
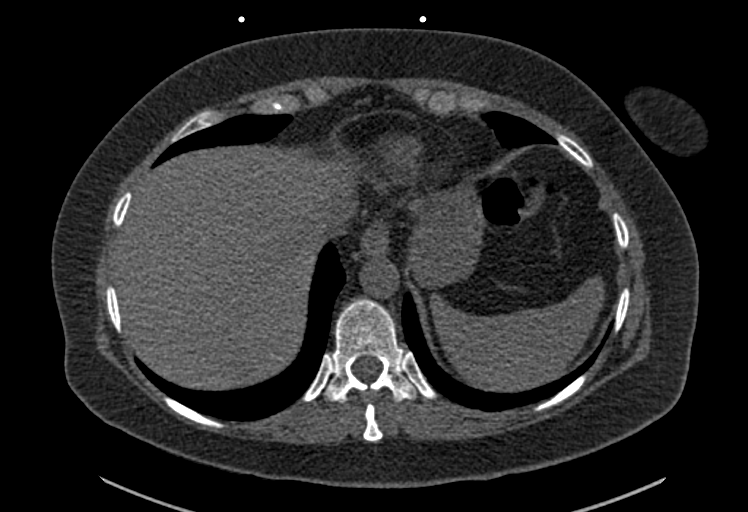
[im 12/70  lung]
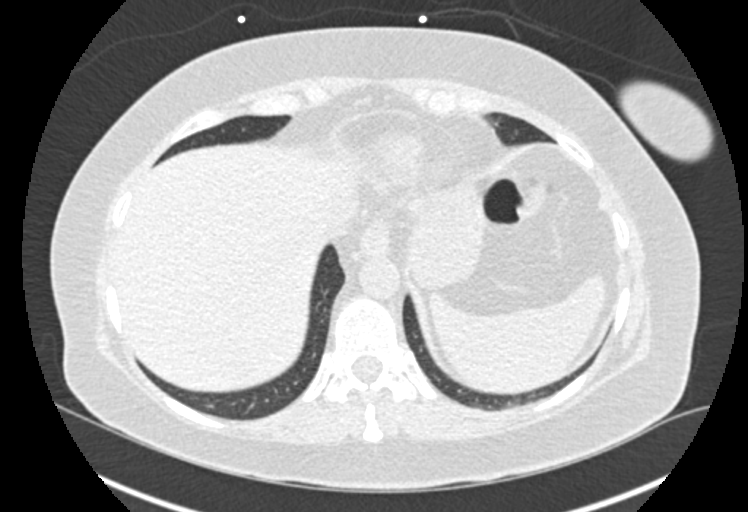
[im 24/70  vessel]
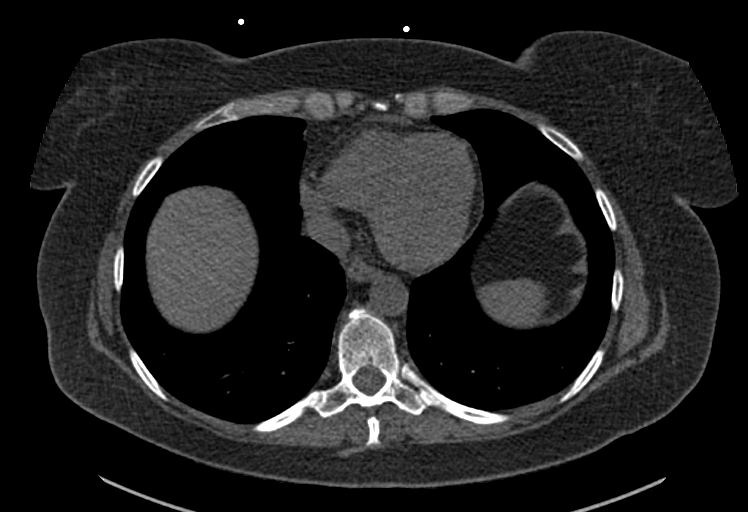
[im 35/70  vessel]
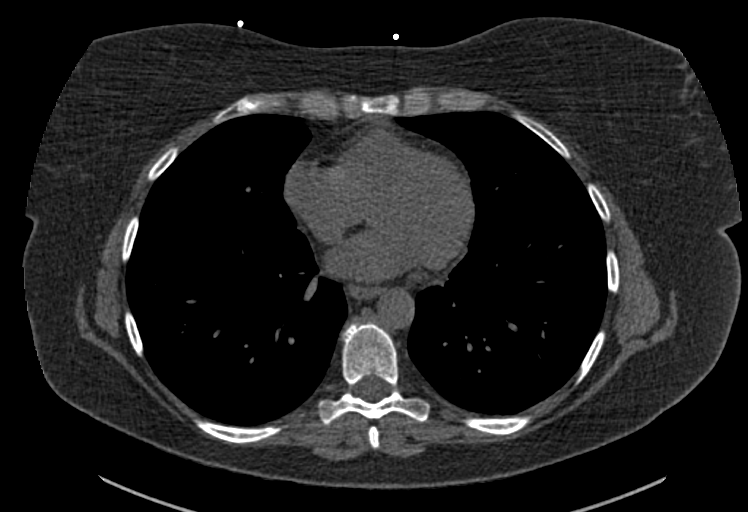
[im 47/70  vessel]
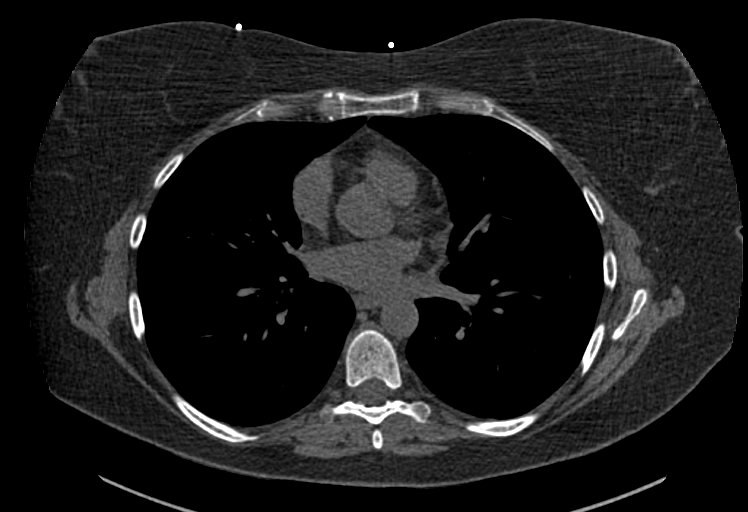
[im 58/70  vessel]
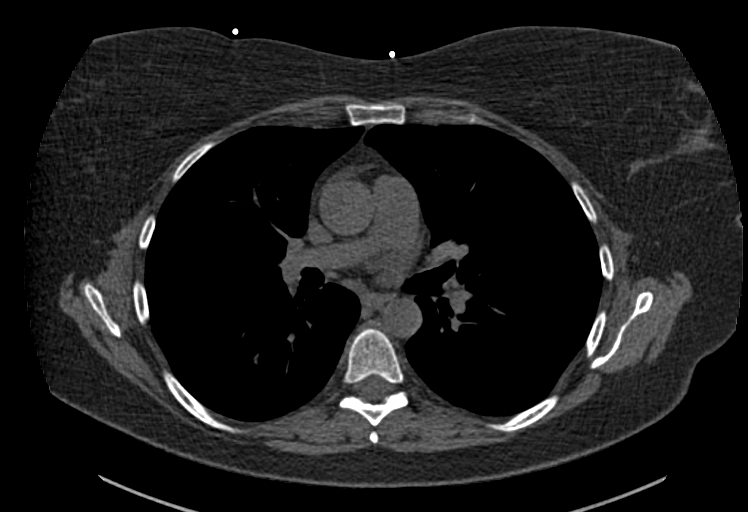
[im 58/70  lung]
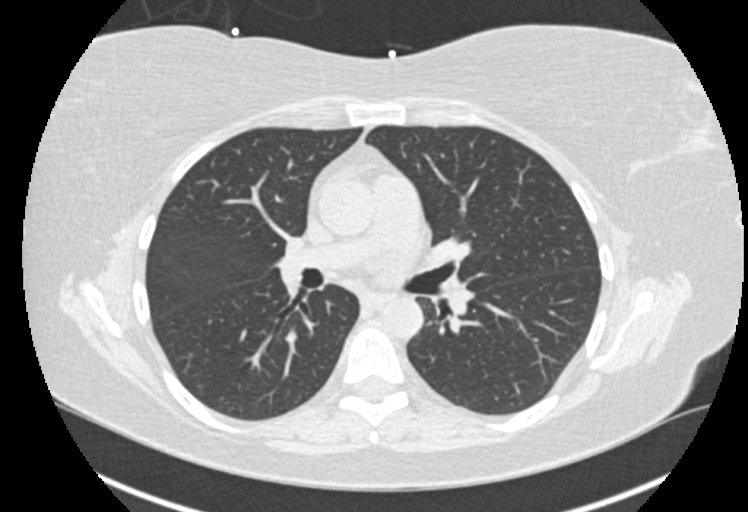

[Series 9: calcium scoring 2.00 br60 bestdiast 69% lungs · axial · 0.54mm/px · z∈[+1749,+1841]mm · 5 of 70 slices shown]
[im 12/70  vessel]
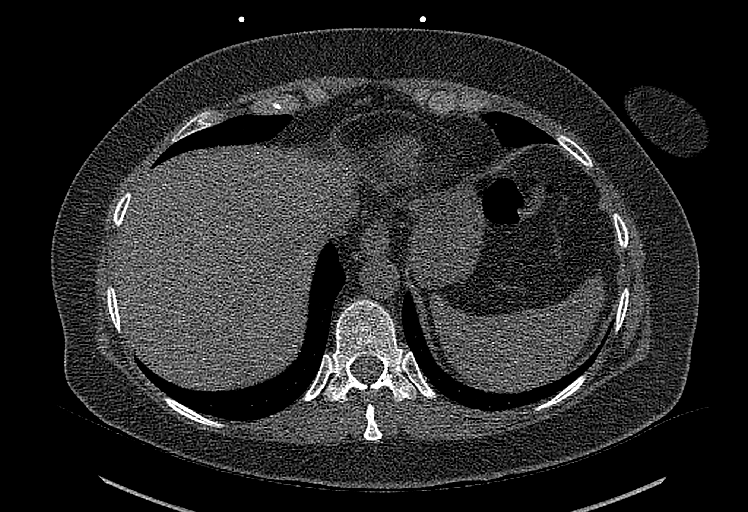
[im 24/70  vessel]
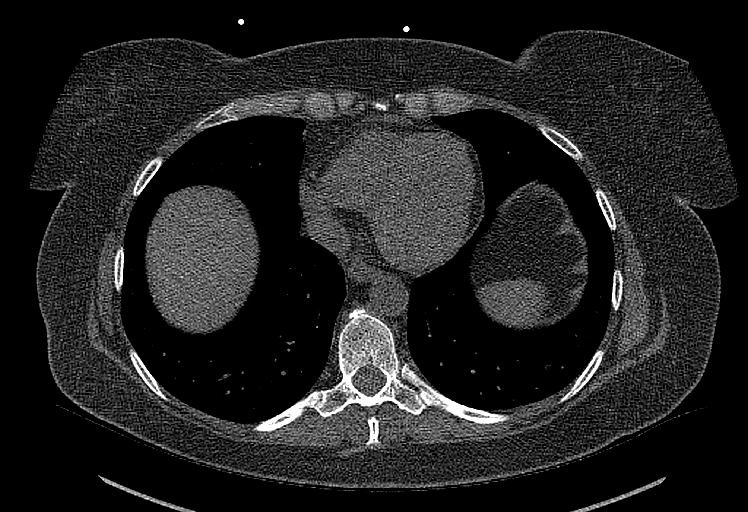
[im 35/70  vessel]
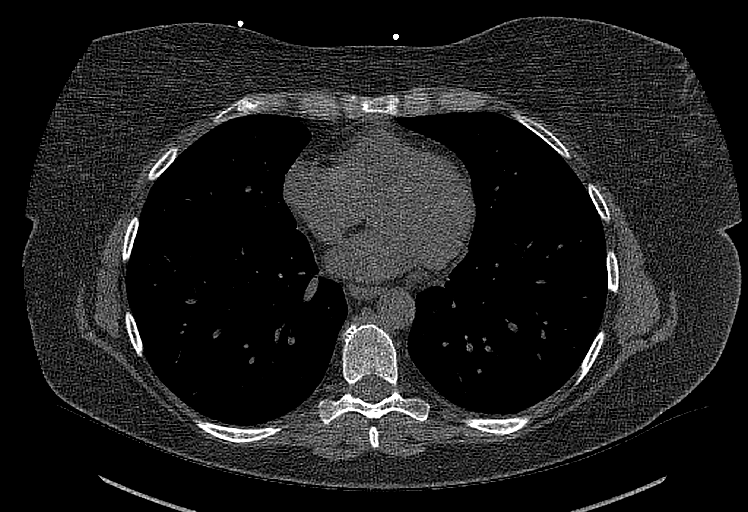
[im 47/70  vessel]
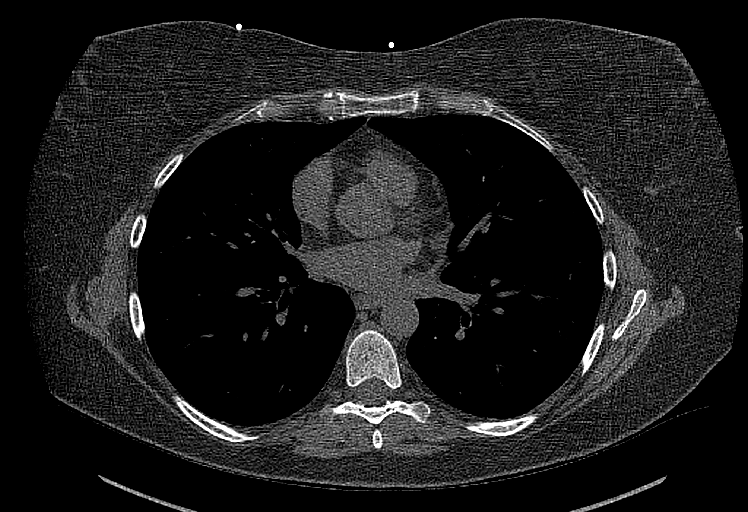
[im 58/70  vessel]
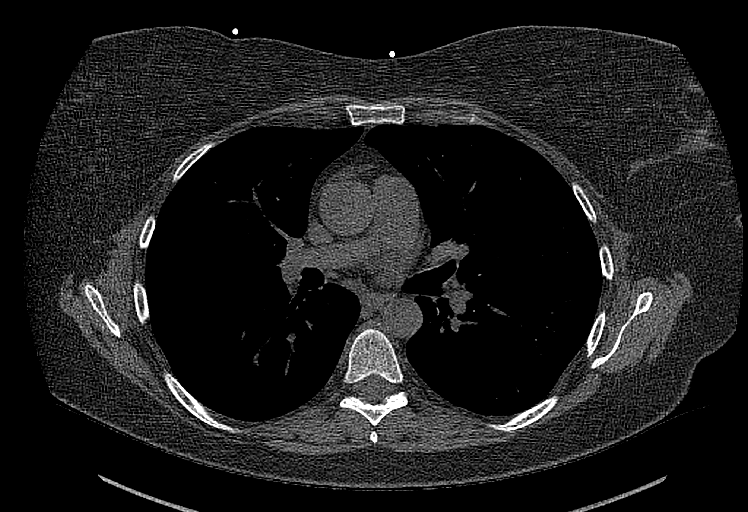

[12 of 20 positions shown; findings below may reference images not displayed]

FINDINGS: CORONARY CALCIUM SCORES:

Left Main: 0

LAD: 0

LCx: 0

RCA: 0

Total Agatston Score: 0

[HOSPITAL] percentile: 0

AORTA MEASUREMENTS:

Ascending Aorta: 31 mm

Descending Aorta: 22 mm

OTHER FINDINGS:

No evidence for coronary artery calcium. Heart size is normal.
Minimal pericardial fluid. Visualized mediastinal structures are
unremarkable. Again noted is a hypodensity in the posterior right
hepatic lobe which was present on the previous examinations and
suggestive for a benign etiology. Remainder of the upper abdominal
structures are unremarkable. Visualized lungs are clear without
pleural fluid. Bone structures are unremarkable.
IMPRESSION: Coronary calcium score is 0. This is at percentile 0 for patients of
the same age, gender and ethnicity.
